# Patient Record
Sex: Female | Born: 1980
Health system: Southern US, Community
[De-identification: ages and names within clinical notes are randomized; demographics above are authoritative.]

## PROBLEM LIST (undated history)

## (undated) DIAGNOSIS — R7303 Prediabetes: Secondary | ICD-10-CM

## (undated) DIAGNOSIS — F419 Anxiety disorder, unspecified: Secondary | ICD-10-CM

## (undated) DIAGNOSIS — I10 Essential (primary) hypertension: Secondary | ICD-10-CM

---

## 2010-12-16 ENCOUNTER — Emergency Department: Payer: Self-pay | Admitting: Emergency Medicine

## 2016-09-27 ENCOUNTER — Encounter: Payer: Self-pay | Admitting: Gynecology

## 2016-09-27 ENCOUNTER — Ambulatory Visit
Admission: EM | Admit: 2016-09-27 | Discharge: 2016-09-27 | Disposition: A | Discharge status: Home / Self Care | Payer: 59 | Attending: Family Medicine | Admitting: Family Medicine

## 2016-09-27 DIAGNOSIS — M62830 Muscle spasm of back: Secondary | ICD-10-CM

## 2016-09-27 DIAGNOSIS — M62838 Other muscle spasm: Secondary | ICD-10-CM

## 2016-09-27 DIAGNOSIS — M545 Low back pain, unspecified: Secondary | ICD-10-CM

## 2016-09-27 HISTORY — DX: Essential (primary) hypertension: I10

## 2016-09-27 HISTORY — DX: Anxiety disorder, unspecified: F41.9

## 2016-09-27 MED ORDER — MELOXICAM 15 MG PO TABS: 15.0000 mg | ORAL_TABLET | Freq: Every day | ORAL | 0 refills | Status: DC | PRN

## 2016-09-27 MED ORDER — CYCLOBENZAPRINE HCL 10 MG PO TABS: 10.0000 mg | ORAL_TABLET | Freq: Two times a day (BID) | ORAL | 0 refills | Status: DC | PRN

## 2016-09-27 NOTE — ED Triage Notes (Signed)
Per patient severed back spasm x couple weeks. 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

## 2016-09-27 NOTE — Discharge Instructions (Signed)
Take medication as prescribed. Rest. Drink plenty of fluids. Stretch. Avoid strenuous activity. ° °Follow up with your primary care physician this week as needed. Return to Urgent care for new or worsening concerns.  ° °

## 2016-09-27 NOTE — ED Provider Notes (Signed)
[facility] ____________________________________________  Time seen: Approximately 6:11 PM  I have reviewed the triage vital signs and the nursing notes.   HISTORY  Chief Complaint Back Pain   HPI Doug SouDanielle D Streeper is a 36 y.o. female presenting for evaluation of low back pain that has been present for approximately 2 weeks. Patient reports she initially felt like she "tweaked" her back, saying she felt like she probably twisted the wrong way at some point. Denies any abrupt onset of back pain. States pain has gradually worsened. Patient states now acute worsening of pain in the last several days. Patient states that if she is being completely still she has minimal pain. States pain is mostly with twisting and bending. States she has taken some over-the-counter ibuprofen with slight improvement. Denies full resolution. Denies other alleviating measures. States occasionally notes some pain radiation to right buttocks, denies any other radiation. Denies any constant or current pain radiation. Denies any paresthesias, weakness, decreased range of motion, fall, trauma or known injury. States pain is primarily right lower back. States that she feels like she can fill a catching pain when she moves. Reports otherwise feels well. Denies any urinary changes, urinary pharmacy, urinary urgency, burning with urination or abdominal pain. Denies fevers. Reports otherwise feels well. Denies chronic back issues. Patient does however states that she does a lot of twisting moving bending and some lifting at work. Patient also states that she has been doing more working out at the gym over the last several months. Denies any heavy lifting.   Denies chest pain, shortness of breath, abdominal pain, dysuria, extremity pain, extremity swelling or rash. Denies recent sickness. Denies recent antibiotic use.   Patient's last menstrual period was 09/23/2016. Denies pregnancy.   Past Medical History:    Diagnosis Date  . Anxiety   . Hypertension     There are no active problems to display for this patient.   History reviewed. No pertinent surgical history.   No current facility-administered medications for this encounter.   Current Outpatient Prescriptions:  .  amLODipine (NORVASC) 10 MG tablet, Take 10 mg by mouth daily., Disp: , Rfl:  .  buPROPion (WELLBUTRIN SR) 150 MG 12 hr tablet, Take 150 mg by mouth 2 (two) times daily., Disp: , Rfl:  .  hydrochlorothiazide (HYDRODIURIL) 25 MG tablet, Take 25 mg by mouth daily., Disp: , Rfl:  .  cyclobenzaprine (FLEXERIL) 10 MG tablet, Take 1 tablet (10 mg total) by mouth 2 (two) times daily as needed for muscle spasms. Do not drive while taking as can cause drowsiness, Disp: 15 tablet, Rfl: 0 .  meloxicam (MOBIC) 15 MG tablet, Take 1 tablet (15 mg total) by mouth daily as needed., Disp: 10 tablet, Rfl: 0  Allergies Patient has no known allergies.  No family history on file.  Social History Social History  Substance Use Topics  . Smoking status: Current Some Day Smoker  . Smokeless tobacco: Never Used  . Alcohol use No    Review of Systems Constitutional: No fever/chills Cardiovascular: Denies chest pain. Respiratory: Denies shortness of breath. Gastrointestinal: No abdominal pain.  No nausea, no vomiting.   Genitourinary: Negative for dysuria. Musculoskeletal: positive for back pain. Skin: Negative for rash.  ____________________________________________   PHYSICAL EXAM:  VITAL SIGNS: ED Triage Vitals  Enc Vitals Group     BP 09/27/16 1446 (!) 151/63     Pulse Rate 09/27/16 1446 65     Resp 09/27/16 1446 16     Temp  09/27/16 1446 99 F (37.2 C)     Temp Source 09/27/16 1446 Oral     SpO2 09/27/16 1446 100 %     Weight 09/27/16 1444 (!) 325 lb (147.4 kg)     Height 09/27/16 1444 5\' 9"  (1.753 m)     Head Circumference --      Peak Flow --      Pain Score 09/27/16 1444 8     Pain Loc --      Pain Edu? --       Excl. in GC? --     Constitutional: Alert and oriented. Well appearing and in no acute distress.. Cardiovascular: Normal rate, regular rhythm. Grossly normal heart sounds.  Good peripheral circulation. Respiratory: Normal respiratory effort without tachypnea nor retractions. Breath sounds are clear and equal bilaterally. No wheezes, rales, rhonchi. Gastrointestinal: Soft and nontender. Obese abdomen.  Musculoskeletal:  No midline cervical, thoracic or lumbar tenderness to palpation. Bilateral pedal pulses equal and easily palpated. ambulatory with steady gait. Changes positions quickly in room.       Right lower leg:  No tenderness or edema.      Left lower leg:  No tenderness or edema.   No midline tenderness. Patient has right lower paralumbar and right lower latissimus dorsi point tenderness, no bony tenderness, full lumbar range of motion present, pain increases with right and left lumbar rotation with palpable muscle spasm, mild pain with lumbar flexion and extension,  bilateral plantar flexion and dorsiflexion strong and equal, no saddle anesthesia, steady gait.  Neurologic:  Normal speech and language. No gross focal neurologic deficits are appreciated. Speech is normal. No gait instability.  Skin:  Skin is warm, dry. Psychiatric: Mood and affect are normal. Speech and behavior are normal. Patient exhibits appropriate insight and judgment   ___________________________________________   LABS (all labs ordered are listed, but only abnormal results are displayed)  Labs Reviewed - No data to display ____________________________________________  RADIOLOGY  No results found. ____________________________________________   PROCEDURES Procedures   INITIAL IMPRESSION / ASSESSMENT AND PLAN / ED COURSE  Pertinent labs & imaging results that were available during my care of the patient were reviewed by me and considered in my medical decision making (see chart for  details).  Well-appearing patient. No acute distress. Suspect lumbar strain with muscle spasm. As no direct trauma or fall, will defer x-ray at this time, patient agrees. Will treat patient with oral daily mobic and muscle relaxant. Encouraged rest, stretch, ice heat alternation, fluids, and supportive care. Avoidance of strenuous activity. Work note given for today and tomorrow.Discussed indication, risks and benefits of medications with patient.  Discussed follow up with Primary care physician this week. Discussed follow up and return parameters including no resolution or any worsening concerns. Patient verbalized understanding and agreed to plan.   ____________________________________________   FINAL CLINICAL IMPRESSION(S) / ED DIAGNOSES  Final diagnoses:  Acute right-sided low back pain without sciatica  Muscle spasm     Discharge Medication List as of 09/27/2016  3:58 PM    START taking these medications   Details  cyclobenzaprine (FLEXERIL) 10 MG tablet Take 1 tablet (10 mg total) by mouth 2 (two) times daily as needed for muscle spasms. Do not drive while taking as can cause drowsiness, Starting Sun 09/27/2016, Normal    meloxicam (MOBIC) 15 MG tablet Take 1 tablet (15 mg total) by mouth daily as needed., Starting Sun 09/27/2016, Normal        Note: This dictation was  prepared with Dragon dictation along with smaller phrase technology. Any transcriptional errors that result from this process are unintentional.         Renford DillsMiller, Jozette Castrellon, NP 09/27/16 [id number]

## 2017-10-27 DIAGNOSIS — E7800 Pure hypercholesterolemia, unspecified: Secondary | ICD-10-CM | POA: Diagnosis not present

## 2017-10-27 DIAGNOSIS — J209 Acute bronchitis, unspecified: Secondary | ICD-10-CM | POA: Diagnosis not present

## 2017-10-27 DIAGNOSIS — J309 Allergic rhinitis, unspecified: Secondary | ICD-10-CM | POA: Diagnosis not present

## 2017-10-27 DIAGNOSIS — J029 Acute pharyngitis, unspecified: Secondary | ICD-10-CM | POA: Diagnosis not present

## 2017-11-08 DIAGNOSIS — T7840XA Allergy, unspecified, initial encounter: Secondary | ICD-10-CM | POA: Diagnosis not present

## 2017-11-25 ENCOUNTER — Other Ambulatory Visit (HOSPITAL_COMMUNITY)
Admission: RE | Admit: 2017-11-25 | Discharge: 2017-11-25 | Disposition: A | Discharge status: Home / Self Care | Payer: 59 | Source: Ambulatory Visit | Attending: Certified Nurse Midwife | Admitting: Certified Nurse Midwife

## 2017-11-25 ENCOUNTER — Ambulatory Visit (INDEPENDENT_AMBULATORY_CARE_PROVIDER_SITE_OTHER): Payer: 59 | Admitting: Certified Nurse Midwife

## 2017-11-25 ENCOUNTER — Encounter: Payer: Self-pay | Admitting: Certified Nurse Midwife

## 2017-11-25 VITALS — BP 150/98 | HR 96 | Ht 69.0 in | Wt 364.1 lb

## 2017-11-25 DIAGNOSIS — Z01419 Encounter for gynecological examination (general) (routine) without abnormal findings: Secondary | ICD-10-CM | POA: Insufficient documentation

## 2017-11-25 DIAGNOSIS — Z124 Encounter for screening for malignant neoplasm of cervix: Secondary | ICD-10-CM | POA: Diagnosis not present

## 2017-11-25 DIAGNOSIS — Z113 Encounter for screening for infections with a predominantly sexual mode of transmission: Secondary | ICD-10-CM | POA: Diagnosis not present

## 2017-11-25 MED ORDER — BUPROPION HCL ER (XL) 150 MG PO TB24: 150.0000 mg | ORAL_TABLET | Freq: Every day | ORAL | 2 refills | Status: DC

## 2017-11-25 NOTE — Progress Notes (Signed)
ANNUAL PREVENTATIVE CARE GYN  ENCOUNTER NOTE  Subjective:       Denise Vaughn is a 37 y.o. G0P0000 female here for a routine annual gynecologic exam.  Current complaints: 1. Needs Pap smear 2. Desires STI testing 3. Would like to restart medication for anxiety 4. Questions smoking cessation  PCP: [facility]. Forgot to take evening dose of Norvasc last night. Stopped taking Wellbutrin, because she felt like it "wasn't working".   Goes to the gym approximately three (3) times a week. Has lost 60 pounds. Smokes three (3) black and milds per day. Works in [employer]. Take blood pressure medication at night due to limited breaks allowed while working.   Denies difficulty breathing or respiratory distress, chest pain, abdominal pain, excessive vaginal bleeding, dysuria, and leg pain.    Gynecologic History  Patient's last menstrual period was 11/13/2017 (exact date).  Period Cycle (Days): 28 Period Duration (Days): Three (3) to four (4) Period Pattern: Regular Menstrual Flow: Heavy Menstrual Control: Maxi pad Menstrual Control Change Freq (Hours): Six (6) to eight (8) Dysmenorrhea: (!) Mild Dysmenorrhea Symptoms: Cramping  Contraception: condoms   Last Pap: unsure.   Obstetric History  OB History  Gravida Para Term Preterm AB Living  0 0 0 0 0 0  SAB TAB Ectopic Multiple Live Births  0 0 0 0 0    Past Medical History:  Diagnosis Date  . Anxiety   . Hypertension     No past surgical history on file.  Current Outpatient Medications on File Prior to Visit  Medication Sig Dispense Refill  . amLODipine (NORVASC) 10 MG tablet Take 10 mg by mouth daily.    . cyclobenzaprine (FLEXERIL) 10 MG tablet Take 1 tablet (10 mg total) by mouth 2 (two) times daily as needed for muscle spasms. Do not drive while taking as can cause drowsiness 15 tablet 0  . hydrochlorothiazide (HYDRODIURIL) 25 MG tablet Take 25 mg by mouth daily.    . meloxicam (MOBIC) 15 MG tablet Take 1  tablet (15 mg total) by mouth daily as needed. 10 tablet 0  . buPROPion (WELLBUTRIN SR) 150 MG 12 hr tablet Take 150 mg by mouth 2 (two) times daily.     No current facility-administered medications on file prior to visit.     Allergies  Allergen Reactions  . Augmentin [Amoxicillin-Pot Clavulanate] Rash    Itching and facial swelling    Social History   Socioeconomic History  . Marital status: Single    Spouse name: Not on file  . Number of children: Not on file  . Years of education: Not on file  . Highest education level: Not on file  Occupational History  . Not on file  Social Needs  . Financial resource strain: Not on file  . Food insecurity:    Worry: Not on file    Inability: Not on file  . Transportation needs:    Medical: Not on file    Non-medical: Not on file  Tobacco Use  . Smoking status: Current Some Day Smoker  . Smokeless tobacco: Never Used  Substance and Sexual Activity  . Alcohol use: No  . Drug use: No  . Sexual activity: Not Currently    Birth control/protection: Condom  Lifestyle  . Physical activity:    Days per week: Not on file    Minutes per session: Not on file  . Stress: Not on file  Relationships  . Social connections:    Talks on phone: Not  on file    Gets together: Not on file    Attends religious service: Not on file    Active member of club or organization: Not on file    Attends meetings of clubs or organizations: Not on file    Relationship status: Not on file  . Intimate partner violence:    Fear of current or ex partner: Not on file    Emotionally abused: Not on file    Physically abused: Not on file    Forced sexual activity: Not on file  Other Topics Concern  . Not on file  Social History Narrative  . Not on file    Family History  Problem Relation Age of Onset  . Diabetes Mother     The following portions of the patient's history were reviewed and updated as appropriate: allergies, current medications, past  family history, past medical history, past social history, past surgical history and problem list.  Review of Systems  ROS negative except as noted above. Information obtained from patient.    Objective:   BP (!) 150/98   Pulse 96   Ht 5\' 9"  (1.753 m)   Wt (!) 364 lb 1 oz (165.1 kg)   LMP 11/13/2017 (Exact Date)   BMI 53.76 kg/m    CONSTITUTIONAL: Well-developed, well-nourished female in no acute distress.   PSYCHIATRIC: Normal mood and affect. Normal behavior. Normal judgment and thought content.  NEUROLGIC: Alert and oriented to person, place, and time. Normal muscle tone coordination. No cranial nerve deficit noted.  HENT:  Normocephalic, atraumatic, External right and left ear normal.   EYES: Conjunctivae and EOM are normal. Pupils are equal and round.   NECK: Normal range of motion, supple, no masses.  Normal thyroid.   SKIN: Skin is warm and dry. No rash noted. Not diaphoretic. No erythema. No pallor.  CARDIOVASCULAR: Normal heart rate noted, regular rhythm, no murmur.  RESPIRATORY: Clear to auscultation bilaterally. Effort and breath sounds normal, no problems with respiration noted.  BREASTS: Symmetric in size. No masses, skin changes, nipple drainage, or lymphadenopathy.  ABDOMEN: Soft, normal bowel sounds, no distention noted.  No tenderness, rebound or guarding. Obese.   PELVIC:  External Genitalia: Normal  Vagina: Normal  Cervix: Normal, Pap collected  Uterus: Unable to palpate due to habitus  Adnexa: Unable to palpate due to habitus   MUSCULOSKELETAL: Normal range of motion. No tenderness.  No cyanosis, clubbing, or edema.  2+ distal pulses.  LYMPHATIC: No Axillary, Supraclavicular, or Inguinal Adenopathy.  GAD 7 : Generalized Anxiety Score 11/25/2017  Nervous, Anxious, on Edge 2  Control/stop worrying 3  Worry too much - different things 3  Trouble relaxing 3  Restless 2  Easily annoyed or irritable 2  Afraid - awful might happen 1  Total GAD 7  Score 16  Anxiety Difficulty Not difficult at all    Assessment:   Annual gynecologic examination 37 y.o.   Contraception: condoms   Class 3 Obesity   Problem List Items Addressed This Visit    None    Visit Diagnoses    Well woman exam    -  Primary   Relevant Orders   Cytology - PAP   Morbid obesity (HCC)       Screening for cervical cancer       Relevant Orders   Cytology - PAP   Screen for STD (sexually transmitted disease)       Relevant Orders   Cytology - PAP  Plan:   Pap: See orders  Labs: Managed by PCP   Routine preventative health maintenance measures emphasized: Exercise/Diet/Weight control, Tobacco Warnings, Alcohol/Substance use risks, Stress Management and Safe Sex; see AVS  Education regarding  1-800-Quitline and special programs in [place]; pt verbalized understanding  Rx: Wellbutrin, see orders  Reviewed red flag symptoms and when to call  RTC x 4-6 weeks for medication check  RTC x 1 year for ANNUAL EXAM or sooner if needed   Gunnar Bulla, CNM [facility]

## 2017-11-25 NOTE — Patient Instructions (Addendum)
Preventive Care 18-39 Years, Female Preventive care refers to lifestyle choices and visits with your health care provider that can promote health and wellness. What does preventive care include?  A yearly physical exam. This is also called an annual well check.  Dental exams once or twice a year.  Routine eye exams. Ask your health care provider how often you should have your eyes checked.  Personal lifestyle choices, including: ? Daily care of your teeth and gums. ? Regular physical activity. ? Eating a healthy diet. ? Avoiding tobacco and drug use. ? Limiting alcohol use. ? Practicing safe sex. ? Taking vitamin and mineral supplements as recommended by your health care provider. What happens during an annual well check? The services and screenings done by your health care provider during your annual well check will depend on your age, overall health, lifestyle risk factors, and family history of disease. Counseling Your health care provider may ask you questions about your:  Alcohol use.  Tobacco use.  Drug use.  Emotional well-being.  Home and relationship well-being.  Sexual activity.  Eating habits.  Work and work Statistician.  Method of birth control.  Menstrual cycle.  Pregnancy history.  Screening You may have the following tests or measurements:  Height, weight, and BMI.  Diabetes screening. This is done by checking your blood sugar (glucose) after you have not eaten for a while (fasting).  Blood pressure.  Lipid and cholesterol levels. These may be checked every 5 years starting at age 66.  Skin check.  Hepatitis C blood test.  Hepatitis B blood test.  Sexually transmitted disease (STD) testing.  BRCA-related cancer screening. This may be done if you have a family history of breast, ovarian, tubal, or peritoneal cancers.  Pelvic exam and Pap test. This may be done every 3 years starting at age 40. Starting at age 59, this may be done every 5  years if you have a Pap test in combination with an HPV test.  Discuss your test results, treatment options, and if necessary, the need for more tests with your health care provider. Vaccines Your health care provider may recommend certain vaccines, such as:  Influenza vaccine. This is recommended every year.  Tetanus, diphtheria, and acellular pertussis (Tdap, Td) vaccine. You may need a Td booster every 10 years.  Varicella vaccine. You may need this if you have not been vaccinated.  HPV vaccine. If you are 69 or younger, you may need three doses over 6 months.  Measles, mumps, and rubella (MMR) vaccine. You may need at least one dose of MMR. You may also need a second dose.  Pneumococcal 13-valent conjugate (PCV13) vaccine. You may need this if you have certain conditions and were not previously vaccinated.  Pneumococcal polysaccharide (PPSV23) vaccine. You may need one or two doses if you smoke cigarettes or if you have certain conditions.  Meningococcal vaccine. One dose is recommended if you are age 27-21 years and a first-year college student living in a residence hall, or if you have one of several medical conditions. You may also need additional booster doses.  Hepatitis A vaccine. You may need this if you have certain conditions or if you travel or work in places where you may be exposed to hepatitis A.  Hepatitis B vaccine. You may need this if you have certain conditions or if you travel or work in places where you may be exposed to hepatitis B.  Haemophilus influenzae type b (Hib) vaccine. You may need this if  you have certain risk factors.  Talk to your health care provider about which screenings and vaccines you need and how often you need them. This information is not intended to replace advice given to you by your health care provider. Make sure you discuss any questions you have with your health care provider. Document Released: 04/07/2001 Document Revised: 10/30/2015  Document Reviewed: 12/11/2014 Elsevier Interactive Patient Education  2018 [employer].  Bupropion tablets (Depression/Mood Disorders) What is this medicine? BUPROPION (byoo PROE pee on) is used to treat depression. This medicine may be used for other purposes; ask your health care provider or pharmacist if you have questions. COMMON BRAND NAME(S): Wellbutrin What should I tell my health care provider before I take this medicine? They need to know if you have any of these conditions: -an eating disorder, such as anorexia or bulimia -bipolar disorder or psychosis -diabetes or high blood sugar, treated with medication -glaucoma -heart disease, previous heart attack, or irregular heart beat -head injury or brain tumor -high blood pressure -kidney or liver disease -seizures -suicidal thoughts or a previous suicide attempt -Tourette's syndrome -weight loss -an unusual or allergic reaction to bupropion, other medicines, foods, dyes, or preservatives -breast-feeding -pregnant or trying to become pregnant How should I use this medicine? Take this medicine by mouth with a glass of water. Follow the directions on the prescription label. You can take it with or without food. If it upsets your stomach, take it with food. Take your medicine at regular intervals. Do not take your medicine more often than directed. Do not stop taking this medicine suddenly except upon the advice of your doctor. Stopping this medicine too quickly may cause serious side effects or your condition may worsen. A special MedGuide will be given to you by the pharmacist with each prescription and refill. Be sure to read this information carefully each time. Talk to your pediatrician regarding the use of this medicine in children. Special care may be needed. Overdosage: If you think you have taken too much of this medicine contact a poison control center or emergency room at once. NOTE: This medicine is only for you. Do not  share this medicine with others. What if I miss a dose? If you miss a dose, take it as soon as you can. If it is less than four hours to your next dose, take only that dose and skip the missed dose. Do not take double or extra doses. What may interact with this medicine? Do not take this medicine with any of the following medications: -linezolid -MAOIs like Azilect, Carbex, Eldepryl, Marplan, Nardil, and Parnate -methylene blue (injected into a vein) -other medicines that contain bupropion like Zyban This medicine may also interact with the following medications: -alcohol -certain medicines for anxiety or sleep -certain medicines for blood pressure like metoprolol, propranolol -certain medicines for depression or psychotic disturbances -certain medicines for HIV or AIDS like efavirenz, lopinavir, nelfinavir, ritonavir -certain medicines for irregular heart beat like propafenone, flecainide -certain medicines for Parkinson's disease like amantadine, levodopa -certain medicines for seizures like carbamazepine, phenytoin, phenobarbital -cimetidine -clopidogrel -cyclophosphamide -digoxin -furazolidone -isoniazid -nicotine -orphenadrine -procarbazine -steroid medicines like prednisone or cortisone -stimulant medicines for attention disorders, weight loss, or to stay awake -tamoxifen -theophylline -thiotepa -ticlopidine -tramadol -warfarin This list may not describe all possible interactions. Give your health care provider a list of all the medicines, herbs, non-prescription drugs, or dietary supplements you use. Also tell them if you smoke, drink alcohol, or use illegal drugs. Some items  may interact with your medicine. What should I watch for while using this medicine? Tell your doctor if your symptoms do not get better or if they get worse. Visit your doctor or health care professional for regular checks on your progress. Because it may take several weeks to see the full effects of  this medicine, it is important to continue your treatment as prescribed by your doctor. Patients and their families should watch out for new or worsening thoughts of suicide or depression. Also watch out for sudden changes in feelings such as feeling anxious, agitated, panicky, irritable, hostile, aggressive, impulsive, severely restless, overly excited and hyperactive, or not being able to sleep. If this happens, especially at the beginning of treatment or after a change in dose, call your health care professional. Avoid alcoholic drinks while taking this medicine. Drinking excessive alcoholic beverages, using sleeping or anxiety medicines, or quickly stopping the use of these agents while taking this medicine may increase your risk for a seizure. Do not drive or use heavy machinery until you know how this medicine affects you. This medicine can impair your ability to perform these tasks. Do not take this medicine close to bedtime. It may prevent you from sleeping. Your mouth may get dry. Chewing sugarless gum or sucking hard candy, and drinking plenty of water may help. Contact your doctor if the problem does not go away or is severe. What side effects may I notice from receiving this medicine? Side effects that you should report to your doctor or health care professional as soon as possible: -allergic reactions like skin rash, itching or hives, swelling of the face, lips, or tongue -breathing problems -changes in vision -confusion -elevated mood, decreased need for sleep, racing thoughts, impulsive behavior -fast or irregular heartbeat -hallucinations, loss of contact with reality -increased blood pressure -redness, blistering, peeling or loosening of the skin, including inside the mouth -seizures -suicidal thoughts or other mood changes -unusually weak or tired -vomiting Side effects that usually do not require medical attention (report to your doctor or health care professional if they  continue or are bothersome): -constipation -headache -loss of appetite -nausea -tremors -weight loss This list may not describe all possible side effects. Call your doctor for medical advice about side effects. You may report side effects to FDA at 1-800-FDA-1088. Where should I keep my medicine? Keep out of the reach of children. Store at room temperature between 20 and 25 degrees C (68 and 77 degrees F), away from direct sunlight and moisture. Keep tightly closed. Throw away any unused medicine after the expiration date. NOTE: This sheet is a summary. It may not cover all possible information. If you have questions about this medicine, talk to your doctor, pharmacist, or health care provider.  2018 Elsevier/Gold Standard (2015-08-02 13:44:21)

## 2017-11-28 ENCOUNTER — Encounter: Payer: Self-pay | Admitting: Certified Nurse Midwife

## 2017-11-30 LAB — CYTOLOGY - PAP
CHLAMYDIA, DNA PROBE: NEGATIVE
DIAGNOSIS: NEGATIVE
HPV (WINDOPATH): NOT DETECTED
NEISSERIA GONORRHEA: NEGATIVE
Trichomonas: NEGATIVE

## 2017-11-30 LAB — CERVICOVAGINAL ANCILLARY ONLY: HERPES (WINDOWPATH): NEGATIVE

## 2017-12-02 ENCOUNTER — Telehealth: Payer: Self-pay | Admitting: Certified Nurse Midwife

## 2017-12-02 NOTE — Telephone Encounter (Signed)
The patient called and stated that she would like a call back from her nurse or provider in regards to disclosing her recent test results. The patient stated that she received a notification from MyChart stating that her test results "were in the office and that she needed to call the office to get them" I let the patient know that MyChart doesn't typically send out those kind of notifications, But when the results come in a provider will review them and contact the patient as soon as they possibly can. No other information was disclosed. Please advise.

## 2017-12-03 NOTE — Telephone Encounter (Signed)
Pt viewed mychart message from [facility] with test results on 12/02/17 at 2:30.

## 2018-01-06 ENCOUNTER — Encounter: Payer: 59 | Admitting: Certified Nurse Midwife

## 2018-04-06 DIAGNOSIS — J069 Acute upper respiratory infection, unspecified: Secondary | ICD-10-CM | POA: Diagnosis not present

## 2018-04-12 ENCOUNTER — Other Ambulatory Visit: Payer: Self-pay

## 2018-04-12 ENCOUNTER — Ambulatory Visit
Admission: EM | Admit: 2018-04-12 | Discharge: 2018-04-12 | Disposition: A | Discharge status: Home / Self Care | Payer: 59 | Attending: Family Medicine | Admitting: Family Medicine

## 2018-04-12 ENCOUNTER — Encounter: Payer: Self-pay | Admitting: Emergency Medicine

## 2018-04-12 DIAGNOSIS — R0602 Shortness of breath: Secondary | ICD-10-CM | POA: Diagnosis not present

## 2018-04-12 DIAGNOSIS — I10 Essential (primary) hypertension: Secondary | ICD-10-CM

## 2018-04-12 DIAGNOSIS — Z720 Tobacco use: Secondary | ICD-10-CM | POA: Diagnosis not present

## 2018-04-12 DIAGNOSIS — R0789 Other chest pain: Secondary | ICD-10-CM

## 2018-04-12 NOTE — ED Provider Notes (Signed)
[facility]    CSN: [id number] Arrival date & time: 04/12/18  1833  History   Chief Complaint Chief Complaint  Patient presents with  . Shortness of Breath   HPI  38 year old female presents with shortness of breath.  Patient reports that on Wednesday she had chest pain and tightness.  Radiated to the jaw.  Lasted 10 minutes and then resolved.  Since that time she has had intermittent bouts of chest tightness.  She is also had associated shortness of breath and throat tightness.  She is had some lightheadedness today.  Patient states that she is under a great deal of stress at work and at home.  No current chest pain.  Patient has been drinking a lot of caffeine and taking caffeine pills.  This may be contributing to her symptoms.  Patient is a current smoker.  No other associated symptoms.  No other complaints.  History reviewed as below. Past Medical History:  Diagnosis Date  . Anxiety   . Hypertension   Morbid obesity  OB History    Gravida  0   Para  0   Term  0   Preterm  0   AB  0   Living  0     SAB  0   TAB  0   Ectopic  0   Multiple  0   Live Births  0          Home Medications    Prior to Admission medications   Medication Sig Start Date End Date Taking? Authorizing Provider  amLODipine (NORVASC) 10 MG tablet Take 10 mg by mouth daily.   Yes [provider]  hydrochlorothiazide (HYDRODIURIL) 25 MG tablet Take 25 mg by mouth daily.   Yes [provider]  meloxicam (MOBIC) 15 MG tablet Take 1 tablet (15 mg total) by mouth daily as needed. 09/27/16  Yes Renford Dills, NP  cyclobenzaprine (FLEXERIL) 10 MG tablet Take 1 tablet (10 mg total) by mouth 2 (two) times daily as needed for muscle spasms. Do not drive while taking as can cause drowsiness 09/27/16   Renford Dills, NP    Family History Family History  Problem Relation Age of Onset  . Diabetes Mother     Social History Social History   Tobacco Use  .  Smoking status: Current Some Day Smoker    Types: Cigarettes  . Smokeless tobacco: Never Used  Substance Use Topics  . Alcohol use: No  . Drug use: No     Allergies   Augmentin [amoxicillin-pot clavulanate]   Review of Systems Review of Systems  HENT:       Throat tightness.  Respiratory: Positive for chest tightness and shortness of breath.   Cardiovascular: Positive for chest pain.  Neurological: Positive for light-headedness.   Physical Exam Triage Vital Signs ED Triage Vitals  Enc Vitals Group     BP 04/12/18 1840 (!) 160/81     Pulse Rate 04/12/18 1840 86     Resp 04/12/18 1840 16     Temp 04/12/18 1840 98.9 F (37.2 C)     Temp Source 04/12/18 1840 Oral     SpO2 04/12/18 1840 99 %     Weight 04/12/18 1839 280 lb (127 kg)     Height 04/12/18 1839 5\' 10"  (1.778 m)     Head Circumference --      Peak Flow --      Pain Score 04/12/18 1839 5     Pain  Loc --      Pain Edu? --      Excl. in GC? --    Updated Vital Signs BP (!) 160/81 (BP Location: Left Arm)   Pulse 86   Temp 98.9 F (37.2 C) (Oral)   Resp 16   Ht 5\' 10"  (1.778 m)   Wt 127 kg   LMP 04/05/2018 (Approximate)   SpO2 99%   BMI 40.18 kg/m   Visual Acuity Right Eye Distance:   Left Eye Distance:   Bilateral Distance:    Right Eye Near:   Left Eye Near:    Bilateral Near:     Physical Exam Vitals signs and nursing note reviewed.  Constitutional:      General: She is not in acute distress.    Appearance: She is well-developed. She is obese.  HENT:     Head: Normocephalic and atraumatic.     Nose: Nose normal.  Eyes:     General:        Right eye: No discharge.     Conjunctiva/sclera: Conjunctivae normal.  Cardiovascular:     Rate and Rhythm: Normal rate and regular rhythm.  Pulmonary:     Effort: Pulmonary effort is normal.     Breath sounds: Normal breath sounds. No wheezing, rhonchi or rales.  Neurological:     Mental Status: She is alert.  Psychiatric:        Mood and  Affect: Mood normal.        Behavior: Behavior normal.    UC Treatments / Results  Labs (all labs ordered are listed, but only abnormal results are displayed) Labs Reviewed - No data to display  EKG Interpretation: Normal sinus rhythm with a rate of 71.  Significant artifact noted.  No appreciable ST or T wave changes other than artifact.  Incomplete right bundle branch block.    Radiology No results found.  Procedures Procedures (including critical care time)  Medications Ordered in UC Medications - No data to display  Initial Impression / Assessment and Plan / UC Course  I have reviewed the triage vital signs and the nursing notes.  Pertinent labs & imaging results that were available during my care of the patient were reviewed by me and considered in my medical decision making (see chart for details).    38 year old female presents with chest tightness/pain and SOB.  EKG essentially unremarkable.  This is likely anxiety driven.  Advised rest and conservative care.  Final Clinical Impressions(s) / UC Diagnoses   Final diagnoses:  SOB (shortness of breath)     Discharge Instructions     EKG unremarkable.  This is likely anxiety driven.  Rest.   Take care  Dr. Adriana Simas     ED Prescriptions    None     Controlled Substance Prescriptions [place] Controlled Substance Registry consulted? Not Applicable   Tommie Sams, DO 04/12/18 2132

## 2018-04-12 NOTE — Discharge Instructions (Signed)
EKG unremarkable.  This is likely anxiety driven.  Rest.   Take care  Dr. Adriana Simas

## 2018-04-12 NOTE — ED Triage Notes (Signed)
Patient c/o SOB and chest pain that started on Wed.  Patient states that she has been stressed.  Patient reports having sinus problems prior to these symptoms.

## 2019-01-09 ENCOUNTER — Emergency Department
Admission: EM | Admit: 2019-01-09 | Discharge: 2019-01-09 | Disposition: A | Discharge status: Home / Self Care | Payer: 59 | Attending: Emergency Medicine | Admitting: Emergency Medicine

## 2019-01-09 ENCOUNTER — Other Ambulatory Visit: Payer: Self-pay

## 2019-01-09 ENCOUNTER — Emergency Department: Payer: 59

## 2019-01-09 ENCOUNTER — Encounter: Payer: Self-pay | Admitting: Emergency Medicine

## 2019-01-09 DIAGNOSIS — S39012A Strain of muscle, fascia and tendon of lower back, initial encounter: Secondary | ICD-10-CM | POA: Diagnosis not present

## 2019-01-09 DIAGNOSIS — Y999 Unspecified external cause status: Secondary | ICD-10-CM | POA: Insufficient documentation

## 2019-01-09 DIAGNOSIS — M79605 Pain in left leg: Secondary | ICD-10-CM | POA: Insufficient documentation

## 2019-01-09 DIAGNOSIS — I10 Essential (primary) hypertension: Secondary | ICD-10-CM | POA: Diagnosis not present

## 2019-01-09 DIAGNOSIS — S199XXA Unspecified injury of neck, initial encounter: Secondary | ICD-10-CM | POA: Diagnosis present

## 2019-01-09 DIAGNOSIS — Z79899 Other long term (current) drug therapy: Secondary | ICD-10-CM | POA: Insufficient documentation

## 2019-01-09 DIAGNOSIS — S161XXA Strain of muscle, fascia and tendon at neck level, initial encounter: Secondary | ICD-10-CM | POA: Diagnosis not present

## 2019-01-09 DIAGNOSIS — Y92410 Unspecified street and highway as the place of occurrence of the external cause: Secondary | ICD-10-CM | POA: Insufficient documentation

## 2019-01-09 DIAGNOSIS — Y939 Activity, unspecified: Secondary | ICD-10-CM | POA: Diagnosis not present

## 2019-01-09 DIAGNOSIS — R0789 Other chest pain: Secondary | ICD-10-CM | POA: Diagnosis not present

## 2019-01-09 DIAGNOSIS — F17210 Nicotine dependence, cigarettes, uncomplicated: Secondary | ICD-10-CM | POA: Insufficient documentation

## 2019-01-09 DIAGNOSIS — S29012A Strain of muscle and tendon of back wall of thorax, initial encounter: Secondary | ICD-10-CM | POA: Insufficient documentation

## 2019-01-09 LAB — CBC
HCT: 37.5 % (ref 36.0–46.0)
Hemoglobin: 12.5 g/dL (ref 12.0–15.0)
MCH: 31.1 pg (ref 26.0–34.0)
MCHC: 33.3 g/dL (ref 30.0–36.0)
MCV: 93.3 fL (ref 80.0–100.0)
Platelets: 266 10*3/uL (ref 150–400)
RBC: 4.02 MIL/uL (ref 3.87–5.11)
RDW: 13.1 % (ref 11.5–15.5)
WBC: 11.6 10*3/uL — ABNORMAL HIGH (ref 4.0–10.5)
nRBC: 0 % (ref 0.0–0.2)

## 2019-01-09 LAB — DIFFERENTIAL
Abs Immature Granulocytes: 0.04 10*3/uL (ref 0.00–0.07)
Basophils Absolute: 0 10*3/uL (ref 0.0–0.1)
Basophils Relative: 0 %
Eosinophils Absolute: 0.4 10*3/uL (ref 0.0–0.5)
Eosinophils Relative: 3 %
Immature Granulocytes: 0 %
Lymphocytes Relative: 36 %
Lymphs Abs: 4.2 10*3/uL — ABNORMAL HIGH (ref 0.7–4.0)
Monocytes Absolute: 1 10*3/uL (ref 0.1–1.0)
Monocytes Relative: 8 %
Neutro Abs: 6 10*3/uL (ref 1.7–7.7)
Neutrophils Relative %: 53 %

## 2019-01-09 LAB — COMPREHENSIVE METABOLIC PANEL WITH GFR
ALT: 20 U/L (ref 0–44)
AST: 27 U/L (ref 15–41)
Albumin: 3.8 g/dL (ref 3.5–5.0)
Alkaline Phosphatase: 66 U/L (ref 38–126)
Anion gap: 9 (ref 5–15)
BUN: 14 mg/dL (ref 6–20)
CO2: 25 mmol/L (ref 22–32)
Calcium: 9.5 mg/dL (ref 8.9–10.3)
Chloride: 105 mmol/L (ref 98–111)
Creatinine, Ser: 0.82 mg/dL (ref 0.44–1.00)
GFR calc Af Amer: >60 mL/min (ref >60)
GFR calc non Af Amer: >60 mL/min (ref >60)
Glucose, Bld: 73 mg/dL (ref 70–99)
Potassium: 4.5 mmol/L (ref 3.5–5.1)
Sodium: 139 mmol/L (ref 135–145)
Total Bilirubin: 0.8 mg/dL (ref 0.3–1.2)
Total Protein: 7.8 g/dL (ref 6.5–8.1)

## 2019-01-09 LAB — LIPASE, BLOOD: Lipase: 29 U/L (ref 11–51)

## 2019-01-09 LAB — HCG, QUANTITATIVE, PREGNANCY: hCG, Beta Chain, Quant, S: <1 m[IU]/mL (ref <5)

## 2019-01-09 MED ORDER — IOHEXOL 350 MG/ML SOLN
100.0000 mL | Freq: Once | INTRAVENOUS | Status: AC | PRN
  Administered 2019-01-09: 12:00:00 100 mL via INTRAVENOUS

## 2019-01-09 MED ORDER — KETOROLAC TROMETHAMINE 10 MG PO TABS: 10.0000 mg | ORAL_TABLET | Freq: Four times a day (QID) | ORAL | 0 refills | Status: DC | PRN

## 2019-01-09 MED ORDER — KETOROLAC TROMETHAMINE 30 MG/ML IJ SOLN
15.0000 mg | INTRAMUSCULAR | Status: AC
  Administered 2019-01-09: 13:00:00 15 mg via INTRAVENOUS
  Filled 2019-01-09: qty 1

## 2019-01-09 MED ORDER — SODIUM CHLORIDE 0.9 % IV BOLUS
1000.0000 mL | Freq: Once | INTRAVENOUS | Status: AC
  Administered 2019-01-09: 10:00:00 1000 mL via INTRAVENOUS

## 2019-01-09 MED ORDER — MORPHINE SULFATE (PF) 4 MG/ML IV SOLN
4.0000 mg | Freq: Once | INTRAVENOUS | Status: AC
  Administered 2019-01-09: 4 mg via INTRAVENOUS
  Filled 2019-01-09 (×2): qty 1

## 2019-01-09 NOTE — ED Notes (Signed)
Recollect lavender sent to lab 

## 2019-01-09 NOTE — Discharge Instructions (Signed)
Your imaging and lab tests were all okay today.  We do not see any serious injuries, and your pains appear to be due to various muscle injuries from the impact.  You will have soreness for several days, which can be managed with anti-inflammatory medicine.  Using ice today and a heating pad starting tomorrow can help as well.
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
## 2019-01-09 NOTE — ED Notes (Signed)
Patient transported to CT 

## 2019-01-09 NOTE — ED Notes (Signed)
Esign not working at this time. Pt verbalized discharge instructions and has no questions at this time. 
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
## 2019-01-11 ENCOUNTER — Other Ambulatory Visit: Payer: Self-pay

## 2019-01-11 ENCOUNTER — Encounter: Payer: Self-pay | Admitting: Emergency Medicine

## 2019-01-11 ENCOUNTER — Emergency Department
Admission: EM | Admit: 2019-01-11 | Discharge: 2019-01-11 | Disposition: A | Discharge status: Home / Self Care | Payer: 59 | Attending: Emergency Medicine | Admitting: Emergency Medicine

## 2019-01-11 DIAGNOSIS — M549 Dorsalgia, unspecified: Secondary | ICD-10-CM | POA: Diagnosis not present

## 2019-01-11 DIAGNOSIS — I10 Essential (primary) hypertension: Secondary | ICD-10-CM | POA: Diagnosis not present

## 2019-01-11 DIAGNOSIS — M542 Cervicalgia: Secondary | ICD-10-CM | POA: Insufficient documentation

## 2019-01-11 DIAGNOSIS — F17210 Nicotine dependence, cigarettes, uncomplicated: Secondary | ICD-10-CM | POA: Insufficient documentation

## 2019-01-11 DIAGNOSIS — Z79899 Other long term (current) drug therapy: Secondary | ICD-10-CM | POA: Insufficient documentation

## 2019-01-11 DIAGNOSIS — M7918 Myalgia, other site: Secondary | ICD-10-CM

## 2019-01-11 MED ORDER — METHOCARBAMOL 500 MG PO TABS: 500.0000 mg | ORAL_TABLET | Freq: Four times a day (QID) | ORAL | 0 refills | Status: DC | PRN

## 2019-01-11 MED ORDER — ETODOLAC 400 MG PO TABS: 400.0000 mg | ORAL_TABLET | Freq: Two times a day (BID) | ORAL | 0 refills | Status: DC
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
## 2019-01-11 NOTE — Discharge Instructions (Signed)
Follow-up with the walk-in clinic [facility] if any continued problems.  Again taking etodolac 400 mg twice daily with food.  Methocarbamol 500 mg 1 every 6 hours if needed for muscle spasms.  Be aware that this medication could cause drowsiness and increase your risk for injury.  Do not drive or operate machinery while taking the methocarbamol.  You may use ice or heat to your muscles as needed for discomfort.  Also move as much as possible to decrease the soreness in your muscles.
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
## 2019-05-17 ENCOUNTER — Encounter: Payer: Self-pay | Admitting: Emergency Medicine

## 2019-05-17 ENCOUNTER — Emergency Department
Admission: EM | Admit: 2019-05-17 | Discharge: 2019-05-17 | Disposition: A | Discharge status: Home / Self Care | Payer: 59 | Attending: Student in an Organized Health Care Education/Training Program | Admitting: Student in an Organized Health Care Education/Training Program

## 2019-05-17 ENCOUNTER — Other Ambulatory Visit: Payer: Self-pay

## 2019-05-17 DIAGNOSIS — R0789 Other chest pain: Secondary | ICD-10-CM | POA: Diagnosis not present

## 2019-05-17 DIAGNOSIS — I10 Essential (primary) hypertension: Secondary | ICD-10-CM | POA: Diagnosis not present

## 2019-05-17 DIAGNOSIS — F17210 Nicotine dependence, cigarettes, uncomplicated: Secondary | ICD-10-CM | POA: Insufficient documentation

## 2019-05-17 DIAGNOSIS — R079 Chest pain, unspecified: Secondary | ICD-10-CM

## 2019-05-17 MED ORDER — KETOROLAC TROMETHAMINE 10 MG PO TABS: 10.0000 mg | ORAL_TABLET | Freq: Four times a day (QID) | ORAL | 0 refills | Status: AC | PRN

## 2019-05-17 MED ORDER — BACLOFEN 10 MG PO TABS: 10.0000 mg | ORAL_TABLET | Freq: Two times a day (BID) | ORAL | 1 refills | Status: AC

## 2019-05-17 MED ORDER — KETOROLAC TROMETHAMINE 30 MG/ML IJ SOLN
30.0000 mg | Freq: Once | INTRAMUSCULAR | Status: AC
  Administered 2019-05-17: 30 mg via INTRAMUSCULAR
  Filled 2019-05-17: qty 1

## 2019-05-17 NOTE — ED Triage Notes (Signed)
Pt in via POV, reports ongoing right mid back pain that has moved around to right chest over the last 3 weeks.  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

## 2019-05-17 NOTE — ED Provider Notes (Signed)
Emergency Department Provider Note  ____________________________________________  Time seen: Approximately 7:47 PM  I have reviewed the triage vital signs and the nursing notes.   HISTORY  Chief Complaint Back Pain   Historian Patient    HPI Denise Vaughn is a 39 y.o. female with a history of anxiety and hypertension, presents to the emergency department with right-sided chest pain worsened with deep inspiration, laughter and talking.  Patient states that she feels pain along the right side of her neck and along her upper trapezius.  She has been prescribed meloxicam and Robaxin by her primary care provider for muscle spasms and she reports that her pain has not improved significantly.  She does report that the tenderness along her right chest is reproducible.  She denies shortness of breath, chest tightness or palpitations.  Patient states that she recently started birth control 1 month ago and does have a history of obesity.  She denies recent travel or prolonged immobilization.  She denies history of DVT or PE.  She states that she quit smoking approximately 6 months ago.   Past Medical History:  Diagnosis Date  . Anxiety   . Hypertension      Immunizations up to date:  Yes.     Past Medical History:  Diagnosis Date  . Anxiety   . Hypertension     There are no problems to display for this patient.   History reviewed. No pertinent surgical history.  Prior to Admission medications   Medication Sig Start Date End Date Taking? Authorizing Provider  baclofen (LIORESAL) 10 MG tablet Take 1 tablet (10 mg total) by mouth 2 (two) times daily for 7 days. 05/17/19 05/24/19  Orvil Feil, PA-C  etodolac (LODINE) 400 MG tablet Take 1 tablet (400 mg total) by mouth 2 (two) times daily. 01/11/19   Tommi Rumps, PA-C  ketorolac (TORADOL) 10 MG tablet Take 1 tablet (10 mg total) by mouth every 6 (six) hours as needed for up to 5 days. 05/17/19 05/22/19  Orvil Feil,  PA-C  methocarbamol (ROBAXIN) 500 MG tablet Take 1 tablet (500 mg total) by mouth every 6 (six) hours as needed. 01/11/19   Tommi Rumps, PA-C  Multiple Vitamins-Minerals (EQ MULTIVITAMINS ADULT GUMMY) CHEW Chew 1 tablet by mouth daily.    [provider]    Allergies Augmentin [amoxicillin-pot clavulanate]  Family History  Problem Relation Age of Onset  . Diabetes Mother     Social History Social History   Tobacco Use  . Smoking status: Current Some Day Smoker    Types: Cigarettes  . Smokeless tobacco: Never Used  Substance Use Topics  . Alcohol use: No  . Drug use: No     Review of Systems  Constitutional: No fever/chills Eyes:  No discharge ENT: No upper respiratory complaints. Respiratory: no cough. No SOB/ use of accessory muscles to breath Cardiac: Patient has right sided, pleuritic chest pain.  Gastrointestinal:   No nausea, no vomiting.  No diarrhea.  No constipation. Musculoskeletal: Negative for musculoskeletal pain. Skin: Negative for rash, abrasions, lacerations, ecchymosis.    ____________________________________________   PHYSICAL EXAM:  VITAL SIGNS: ED Triage Vitals [05/17/19 1535]  Enc Vitals Group     BP (!) 146/83     Pulse Rate 62     Resp 19     Temp (!) 97.5 F (36.4 C)     Temp Source Oral     SpO2 100 %     Weight (!) 324 lb  15.3 oz (147.4 kg)     Height 5\' 3"  (1.6 m)     Head Circumference      Peak Flow      Pain Score 8     Pain Loc      Pain Edu?      Excl. in [place]?      Constitutional: Alert and oriented. Well appearing and in no acute distress. Eyes: Conjunctivae are normal. PERRL. EOMI. Head: Atraumatic. ENT:      Ears: TMs are pearly.       Nose: No congestion/rhinnorhea.      Mouth/Throat: Mucous membranes are moist.  Neck: No stridor.  No cervical spine tenderness to palpation.  Cardiovascular: Normal rate, regular rhythm. Normal S1 and S2.  Good peripheral circulation.  Patient's right anterior chest  pain is reproduced with palpation. Respiratory: Normal respiratory effort without tachypnea or retractions. Lungs CTAB. Good air entry to the bases with no decreased or absent breath sounds Gastrointestinal: Bowel sounds x 4 quadrants. Soft and nontender to palpation. No guarding or rigidity. No distention. Musculoskeletal: Full range of motion to all extremities. No obvious deformities noted Neurologic:  Normal for age. No gross focal neurologic deficits are appreciated.  Skin:  Skin is warm, dry and intact. No rash noted. Psychiatric: Mood and affect are normal for age. Speech and behavior are normal.   ____________________________________________   LABS (all labs ordered are listed, but only abnormal results are displayed)  Labs Reviewed - No data to display ____________________________________________  EKG   ____________________________________________  RADIOLOGY   No results found.  ____________________________________________    PROCEDURES  Procedure(s) performed:     Procedures     Medications  ketorolac (TORADOL) 30 MG/ML injection 30 mg (30 mg Intramuscular Given 05/17/19 1802)     ____________________________________________   INITIAL IMPRESSION / ASSESSMENT AND PLAN / ED COURSE  Pertinent labs & imaging results that were available during my care of the patient were reviewed by me and considered in my medical decision making (see chart for details).      Assessment and plan Chest pain 39 year old female presents to the emergency department with pleuritic, reproducible right-sided chest pain that has occurred for the past 2 to 3 days in association with paraspinal muscle tenderness along the right neck and right upper trapezius.  Patient vital signs were reviewed at triage and were reassuring.   Differential diagnosis included muscle spasm, PE, STEMI, NSTEMI  EKG revealed normal sinus rhythm without ST segment elevation or apparent arrhythmia.   Patient did have reproducible discomfort with palpation along the right anterior chest wall, paraspinal muscles of the cervical spine and the right upper trapezius.  I had an extensive conversation with patient about undergoing a cardiac work-up in the emergency department with CTA to rule out PE.  Patient declined cardiac work-up 2 times during this emergency department encounter, stating that she "just wanted anti-inflammatories and a muscle relaxer.".  Patient was informed of the risks associated with an undiagnosed PE, including death.  Patient stated that she would return the emergency department if her symptoms worsen.  ____________________________________________  FINAL CLINICAL IMPRESSION(S) / ED DIAGNOSES  Final diagnoses:  Chest pain, unspecified type      NEW MEDICATIONS STARTED DURING THIS VISIT:  ED Discharge Orders         Ordered    ketorolac (TORADOL) 10 MG tablet  Every 6 hours PRN     05/17/19 1804    baclofen (LIORESAL) 10 MG tablet  2  times daily     05/17/19 1804              This chart was dictated using voice recognition software/Dragon. Despite best efforts to proofread, errors can occur which can change the meaning. Any change was purely unintentional.     Orvil Feil, PA-C 05/17/19 1954    Willy Eddy, MD 05/17/19 2216

## 2019-05-17 NOTE — ED Notes (Signed)
Pt. place in 40A closet room.

## 2019-05-17 NOTE — ED Notes (Signed)
Verified with pt that she does not want to be evaluated for a PE. Pt states she understands the risk and wants to be evaluated by her PCP.
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
## 2019-12-05 ENCOUNTER — Emergency Department
Admission: EM | Admit: 2019-12-05 | Discharge: 2019-12-05 | Disposition: A | Discharge status: Home / Self Care | Payer: Self-pay | Attending: Emergency Medicine | Admitting: Emergency Medicine

## 2019-12-05 ENCOUNTER — Other Ambulatory Visit: Payer: Self-pay

## 2019-12-05 ENCOUNTER — Emergency Department: Payer: Self-pay

## 2019-12-05 DIAGNOSIS — F17210 Nicotine dependence, cigarettes, uncomplicated: Secondary | ICD-10-CM | POA: Insufficient documentation

## 2019-12-05 DIAGNOSIS — M79672 Pain in left foot: Secondary | ICD-10-CM | POA: Insufficient documentation

## 2019-12-05 DIAGNOSIS — G8929 Other chronic pain: Secondary | ICD-10-CM | POA: Insufficient documentation

## 2019-12-05 DIAGNOSIS — I10 Essential (primary) hypertension: Secondary | ICD-10-CM | POA: Insufficient documentation

## 2019-12-05 DIAGNOSIS — M79606 Pain in leg, unspecified: Secondary | ICD-10-CM

## 2019-12-05 DIAGNOSIS — M79604 Pain in right leg: Secondary | ICD-10-CM | POA: Insufficient documentation

## 2019-12-05 LAB — CBC WITH DIFFERENTIAL/PLATELET
Abs Immature Granulocytes: 0.02 10*3/uL (ref 0.00–0.07)
Basophils Absolute: 0 10*3/uL (ref 0.0–0.1)
Basophils Relative: 0 %
Eosinophils Absolute: 0.4 10*3/uL (ref 0.0–0.5)
Eosinophils Relative: 4 %
HCT: 38.6 % (ref 36.0–46.0)
Hemoglobin: 12.9 g/dL (ref 12.0–15.0)
Immature Granulocytes: 0 %
Lymphocytes Relative: 39 %
Lymphs Abs: 3.6 10*3/uL (ref 0.7–4.0)
MCH: 31.7 pg (ref 26.0–34.0)
MCHC: 33.4 g/dL (ref 30.0–36.0)
MCV: 94.8 fL (ref 80.0–100.0)
Monocytes Absolute: 0.8 10*3/uL (ref 0.1–1.0)
Monocytes Relative: 9 %
Neutro Abs: 4.5 10*3/uL (ref 1.7–7.7)
Neutrophils Relative %: 48 %
Platelets: 259 10*3/uL (ref 150–400)
RBC: 4.07 MIL/uL (ref 3.87–5.11)
RDW: 13.5 % (ref 11.5–15.5)
WBC: 9.3 10*3/uL (ref 4.0–10.5)
nRBC: 0 % (ref 0.0–0.2)

## 2019-12-05 LAB — COMPREHENSIVE METABOLIC PANEL
ALT: 18 U/L (ref 0–44)
AST: 16 U/L (ref 15–41)
Albumin: 4.1 g/dL (ref 3.5–5.0)
Alkaline Phosphatase: 61 U/L (ref 38–126)
Anion gap: 8 (ref 5–15)
BUN: 16 mg/dL (ref 6–20)
CO2: 26 mmol/L (ref 22–32)
Calcium: 9.3 mg/dL (ref 8.9–10.3)
Chloride: 105 mmol/L (ref 98–111)
Creatinine, Ser: 0.7 mg/dL (ref 0.44–1.00)
GFR, Estimated: >60 mL/min (ref >60)
Glucose, Bld: 87 mg/dL (ref 70–99)
Potassium: 3.8 mmol/L (ref 3.5–5.1)
Sodium: 139 mmol/L (ref 135–145)
Total Bilirubin: 0.7 mg/dL (ref 0.3–1.2)
Total Protein: 8 g/dL (ref 6.5–8.1)

## 2019-12-05 MED ORDER — KETOROLAC TROMETHAMINE 30 MG/ML IJ SOLN
30.0000 mg | Freq: Once | INTRAMUSCULAR | Status: AC
  Administered 2019-12-05: 30 mg via INTRAMUSCULAR
  Filled 2019-12-05: qty 1

## 2019-12-05 MED ORDER — ETODOLAC 400 MG PO TABS: 400.0000 mg | ORAL_TABLET | Freq: Two times a day (BID) | ORAL | 0 refills | Status: AC

## 2019-12-05 NOTE — Discharge Instructions (Signed)
Follow-up with your primary care provider if any continued problems.  Begin taking etodolac 400 mg twice daily with food.  Increase fluids to stay hydrated.

## 2019-12-05 NOTE — ED Notes (Signed)
See triage note, pt reports pain to right leg after hitting it on sharp object at work a month ago with intermittent difficulty walking.  NAD noted

## 2019-12-05 NOTE — ED Triage Notes (Signed)
Pt comes via POV from home with c/o left foot and right leg pain. Pt states this started couple days ago.
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
## 2020-08-18 ENCOUNTER — Emergency Department: Payer: BLUE CROSS/BLUE SHIELD

## 2020-08-18 ENCOUNTER — Emergency Department
Admission: EM | Admit: 2020-08-18 | Discharge: 2020-08-18 | Disposition: A | Discharge status: Home / Self Care | Payer: BLUE CROSS/BLUE SHIELD | Attending: Student in an Organized Health Care Education/Training Program | Admitting: Student in an Organized Health Care Education/Training Program

## 2020-08-18 ENCOUNTER — Other Ambulatory Visit: Payer: Self-pay

## 2020-08-18 DIAGNOSIS — I10 Essential (primary) hypertension: Secondary | ICD-10-CM | POA: Diagnosis not present

## 2020-08-18 DIAGNOSIS — M79661 Pain in right lower leg: Secondary | ICD-10-CM | POA: Diagnosis present

## 2020-08-18 DIAGNOSIS — I82441 Acute embolism and thrombosis of right tibial vein: Secondary | ICD-10-CM | POA: Diagnosis not present

## 2020-08-18 DIAGNOSIS — Z7901 Long term (current) use of anticoagulants: Secondary | ICD-10-CM | POA: Insufficient documentation

## 2020-08-18 DIAGNOSIS — M25571 Pain in right ankle and joints of right foot: Secondary | ICD-10-CM | POA: Insufficient documentation

## 2020-08-18 DIAGNOSIS — F17210 Nicotine dependence, cigarettes, uncomplicated: Secondary | ICD-10-CM | POA: Insufficient documentation

## 2020-08-18 MED ORDER — APIXABAN 5 MG PO TABS: ORAL_TABLET | ORAL | 6 refills | Status: DC

## 2020-08-18 NOTE — ED Triage Notes (Signed)
Pt states right ankle swelling and pain for 3 weeks. Pt is ambulatory without difficulty. Pt denies known injury, but states is on her feet daily at work.

## 2020-08-18 NOTE — Discharge Instructions (Addendum)
Please wear compression stockings at home. Take 10 mg of Eliquis in the morning and 10 mg at night for the first 7 days.  Then take 5 mg in the morning and 5 mg at night for the next 6 months. Please establish care with a primary care provider.

## 2020-08-18 NOTE — ED Provider Notes (Signed)
[facility]-EMERGENCY DEPARTMENT  ____________________________________________  Time seen: Approximately 8:20 PM  I have reviewed the triage vital signs and the nursing notes.   HISTORY  Chief Complaint Ankle Pain   Historian Patient   HPI Denise Vaughn is a 40 y.o. female presents to the emergency department with right calf pain for the past 3 weeks.  Patient states that she takes birth control at home.  She denies daily smoking or prolonged immobilization.  Denies recent surgery.  Does have a history of obesity.  Patient states that she has right posterior ankle pain that sometimes radiates along the calf.  No recent falls or mechanisms of trauma.  Patient states it has been difficult for her to walk due to the pain.   Past Medical History:  Diagnosis Date   Anxiety    Hypertension      Immunizations up to date:  Yes.     Past Medical History:  Diagnosis Date   Anxiety    Hypertension     There are no problems to display for this patient.   No past surgical history on file.  Prior to Admission medications   Medication Sig Start Date End Date Taking? Authorizing Provider  apixaban (ELIQUIS) 5 MG TABS tablet Take 10 mg in the morning and 10 mg in the evening for the first 7 days.  After 7 days, take 5 mg in the morning and 5 mg in the evening for the next 6 months. 08/18/20  Yes Pia Mau M, PA-C  etodolac (LODINE) 400 MG tablet Take 1 tablet (400 mg total) by mouth 2 (two) times daily. 12/05/19   Tommi Rumps, PA-C  Multiple Vitamins-Minerals (EQ MULTIVITAMINS ADULT GUMMY) CHEW Chew 1 tablet by mouth daily.    [provider]    Allergies Augmentin [amoxicillin-pot clavulanate]  Family History  Problem Relation Age of Onset   Diabetes Mother     Social History Social History   Tobacco Use   Smoking status: Some Days    Pack years: 0.00    Types: Cigarettes   Smokeless tobacco: Never  Vaping Use   Vaping Use: Never used  Substance  Use Topics   Alcohol use: No   Drug use: No     Review of Systems  Constitutional: No fever/chills Eyes:  No discharge ENT: No upper respiratory complaints. Respiratory: no cough. No SOB/ use of accessory muscles to breath Gastrointestinal:   No nausea, no vomiting.  No diarrhea.  No constipation. Musculoskeletal: Patient has right calf pain and ankle pain.  Skin: Negative for rash, abrasions, lacerations, ecchymosis.   ____________________________________________   PHYSICAL EXAM:  VITAL SIGNS: ED Triage Vitals [08/18/20 1924]  Enc Vitals Group     BP (!) 148/105     Pulse Rate 88     Resp 16     Temp 98 F (36.7 C)     Temp src      SpO2 100 %     Weight (!) 350 lb (158.8 kg)     Height 5\' 10"  (1.778 m)     Head Circumference      Peak Flow      Pain Score 7     Pain Loc      Pain Edu?      Excl. in GC?      Constitutional: Alert and oriented. Well appearing and in no acute distress. Eyes: Conjunctivae are normal. PERRL. EOMI. Head: Atraumatic. ENT: Cardiovascular: Normal rate, regular rhythm. Normal S1 and S2.  Good peripheral circulation. Respiratory: Normal respiratory effort without tachypnea or retractions. Lungs CTAB. Good air entry to the bases with no decreased or absent breath sounds Gastrointestinal: Bowel sounds x 4 quadrants. Soft and nontender to palpation. No guarding or rigidity. No distention. Musculoskeletal: Full range of motion to all extremities. No obvious deformities noted.  No erythema or edema of the right lower extremity.  Palpable dorsalis pedis pulse bilaterally and symmetrically.  Capillary refill less than 2 seconds on the right. Neurologic:  Normal for age. No gross focal neurologic deficits are appreciated.  Skin:  Skin is warm, dry and intact. No rash noted. Psychiatric: Mood and affect are normal for age. Speech and behavior are normal.   ____________________________________________   LABS (all labs ordered are listed, but  only abnormal results are displayed)  Labs Reviewed - No data to display ____________________________________________  EKG   ____________________________________________  RADIOLOGY Geraldo Pitter, personally viewed and evaluated these images (plain radiographs) as part of my medical decision making, as well as reviewing the written report by the radiologist.  DG Ankle Complete Right  Result Date: 08/18/2020 CLINICAL DATA:  Right ankle pain, swelling EXAM: RIGHT ANKLE - COMPLETE 3+ VIEW COMPARISON:  None. FINDINGS: Plantar and posterior calcaneal spurs. No acute bony abnormality. Specifically, no fracture, subluxation, or dislocation. Degenerative changes in the right ankle and hindfoot. IMPRESSION: No acute bony abnormality. Electronically Signed   By: Charlett Nose M.D.   On: 08/18/2020 20:02   US Venous Img Lower Unilateral Right  Result Date: 08/18/2020 CLINICAL DATA:  Right lower extremity swelling. EXAM: RIGHT LOWER EXTREMITY VENOUS DOPPLER ULTRASOUND TECHNIQUE: Gray-scale sonography with compression, as well as color and duplex ultrasound, were performed to evaluate the deep venous system(s) from the level of the common femoral vein through the popliteal and proximal calf veins. COMPARISON:  None. FINDINGS: VENOUS Normal compressibility of the RIGHT common femoral, superficial femoral, and popliteal veins, as well as the RIGHT peroneal vein. Visualized portions of RIGHT profunda femoral vein and RIGHT great saphenous vein are unremarkable. Abnormal compressibility of the RIGHT posterior tibial vein is seen. Associated filling defects suggestive of DVT is noted on grayscale or color Doppler imaging. Doppler waveforms show abnormal direction of venous flow, abnormal respiratory plasticity and abnormal response to augmentation within the RIGHT posterior tibial vein. Limited views of the contralateral common femoral vein are unremarkable. OTHER None. Limitations: none IMPRESSION: Findings  consistent with occlusive thrombus within the RIGHT posterior tibial vein. Electronically Signed   By: Aram Candela M.D.   On: 08/18/2020 21:21    ____________________________________________    PROCEDURES  Procedure(s) performed:     Procedures     Medications - No data to display   ____________________________________________   INITIAL IMPRESSION / ASSESSMENT AND PLAN / ED COURSE  Pertinent labs & imaging results that were available during my care of the patient were reviewed by me and considered in my medical decision making (see chart for details).      Assessment and Plan: Ankle pain:  Calf pain:  40 year old female presents to the emergency department with right calf pain for the past 3 weeks.   Vital signs were reassuring at triage. On physical exam, patient had no significant erythema but did have tenderness to right distal calf.   No bony abnormalities were visualized on xray of the right ankle. Venous ultrasound indicated occlusive thrombus of the right posterior tibial vein.   Patient was started on Eliquis. Recommended compression stockings and daily ambulation.  Patient denies chest pain, chest tightness or shortness of breath. Recommended return to the ED for revaluation if aforementioned symptoms occur.     ____________________________________________  FINAL CLINICAL IMPRESSION(S) / ED DIAGNOSES  Final diagnoses:  Acute deep vein thrombosis (DVT) of tibial vein of right lower extremity (HCC)      NEW MEDICATIONS STARTED DURING THIS VISIT:  ED Discharge Orders          Ordered    apixaban (ELIQUIS) 5 MG TABS tablet        08/18/20 2139                This chart was dictated using voice recognition software/Dragon. Despite best efforts to proofread, errors can occur which can change the meaning. Any change was purely unintentional.     Gasper Lloyd 08/18/20 2303    Willy Eddy, MD 08/18/20 2330

## 2020-08-19 ENCOUNTER — Telehealth: Payer: Self-pay | Admitting: Emergency Medicine

## 2020-08-19 MED ORDER — RIVAROXABAN (XARELTO) VTE STARTER PACK (15 & 20 MG): ORAL_TABLET | ORAL | 0 refills | Status: AC

## 2020-08-19 NOTE — Telephone Encounter (Signed)
Switching rx from eliquis to xarelto due to insurance plan

## 2020-10-02 ENCOUNTER — Other Ambulatory Visit: Payer: Self-pay | Admitting: Family Medicine

## 2020-10-02 DIAGNOSIS — Z86718 Personal history of other venous thrombosis and embolism: Secondary | ICD-10-CM

## 2020-10-02 DIAGNOSIS — M7989 Other specified soft tissue disorders: Secondary | ICD-10-CM

## 2020-10-07 ENCOUNTER — Ambulatory Visit: Payer: BLUE CROSS/BLUE SHIELD

## 2020-10-16 ENCOUNTER — Other Ambulatory Visit: Payer: Self-pay

## 2020-10-16 ENCOUNTER — Ambulatory Visit
Admission: RE | Admit: 2020-10-16 | Discharge: 2020-10-16 | Disposition: A | Discharge status: Home / Self Care | Payer: BLUE CROSS/BLUE SHIELD | Source: Ambulatory Visit | Attending: Family Medicine | Admitting: Family Medicine

## 2020-10-16 DIAGNOSIS — Z86718 Personal history of other venous thrombosis and embolism: Secondary | ICD-10-CM | POA: Insufficient documentation

## 2020-10-16 DIAGNOSIS — M7989 Other specified soft tissue disorders: Secondary | ICD-10-CM | POA: Insufficient documentation
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
## 2021-12-23 ENCOUNTER — Telehealth: Payer: Self-pay | Admitting: "Endocrinology

## 2021-12-23 NOTE — Telephone Encounter (Signed)
Received referral on pt 9/7. Called pt left vm 9/8, mailed letter 10/1. No response. Closed referral

## 2022-06-29 ENCOUNTER — Ambulatory Visit: Payer: Self-pay

## 2022-11-04 ENCOUNTER — Ambulatory Visit
Admission: RE | Admit: 2022-11-04 | Discharge: 2022-11-04 | Disposition: A | Discharge status: Home / Self Care | Payer: BLUE CROSS/BLUE SHIELD | Source: Ambulatory Visit

## 2022-11-04 VITALS — BP 169/96 | HR 85 | Temp 98.8°F | Resp 18 | Wt 325.0 lb

## 2022-11-04 DIAGNOSIS — Z8679 Personal history of other diseases of the circulatory system: Secondary | ICD-10-CM

## 2022-11-04 DIAGNOSIS — R739 Hyperglycemia, unspecified: Secondary | ICD-10-CM

## 2022-11-04 DIAGNOSIS — R030 Elevated blood-pressure reading, without diagnosis of hypertension: Secondary | ICD-10-CM | POA: Diagnosis not present

## 2022-11-04 DIAGNOSIS — Z760 Encounter for issue of repeat prescription: Secondary | ICD-10-CM

## 2022-11-04 DIAGNOSIS — Z8639 Personal history of other endocrine, nutritional and metabolic disease: Secondary | ICD-10-CM | POA: Diagnosis not present

## 2022-11-04 HISTORY — DX: Prediabetes: R73.03

## 2022-11-04 LAB — BASIC METABOLIC PANEL
Anion gap: 9 (ref 5–15)
BUN: 12 mg/dL (ref 6–20)
CO2: 27 mmol/L (ref 22–32)
Calcium: 9.7 mg/dL (ref 8.9–10.3)
Chloride: 96 mmol/L — ABNORMAL LOW (ref 98–111)
Creatinine, Ser: 0.75 mg/dL (ref 0.44–1.00)
GFR, Estimated: >60 mL/min (ref >60)
Glucose, Bld: 171 mg/dL — ABNORMAL HIGH (ref 70–99)
Potassium: 3.8 mmol/L (ref 3.5–5.1)
Sodium: 132 mmol/L — ABNORMAL LOW (ref 135–145)

## 2022-11-04 LAB — GLUCOSE, CAPILLARY: Glucose-Capillary: 183 mg/dL — ABNORMAL HIGH (ref 70–99)

## 2022-11-04 MED ORDER — AMLODIPINE BESYLATE 5 MG PO TABS: 5.0000 mg | ORAL_TABLET | Freq: Every day | ORAL | 0 refills | Status: AC

## 2022-11-04 MED ORDER — METFORMIN HCL 500 MG PO TABS: 500.0000 mg | ORAL_TABLET | Freq: Two times a day (BID) | ORAL | 0 refills | Status: AC

## 2022-11-04 NOTE — Discharge Instructions (Addendum)
We have refilled your Norvasc and Metformin. Please follow up with PCP for further refills. Take meds as directed. Avoids extra salt and processed foods. Go to ER for new or worsening issues or concerns(chest pain,shortness of breath,palpitations, etc).

## 2022-11-04 NOTE — ED Provider Notes (Signed)
[facility]    CSN: [id number] Arrival date & time: 11/04/22  1803      History   Chief Complaint Chief Complaint  Patient presents with   Blood Sugar Problem    My general dr office closed and I need my prescriptions renewed until I can find another dr. Margaret Vaughn by patient    HPI Denise Vaughn is a 42 y.o. female.   42 year old female pt, Denise Vaughn, presents to urgent care for refill of meds. Pt has hx of HTN and DM. Pt denies any symptoms at present.   The history is provided by the patient. No language interpreter was used.    Past Medical History:  Diagnosis Date   Anxiety    Hypertension    Prediabetes     Patient Active Problem List   Diagnosis Date Noted   Encounter for medication refill 11/04/2022   History of hypertension 11/04/2022   History of diabetes mellitus 11/04/2022   Hyperglycemia 11/04/2022   Elevated blood pressure reading 11/04/2022    History reviewed. No pertinent surgical history.  OB History     Gravida  0   Para  0   Term  0   Preterm  0   AB  0   Living  0      SAB  0   IAB  0   Ectopic  0   Multiple  0   Live Births  0            Home Medications    Prior to Admission medications   Medication Sig Start Date End Date Taking? Authorizing Provider  hydrochlorothiazide (HYDRODIURIL) 25 MG tablet Take 25 mg by mouth daily. 07/31/22  Yes [provider]  amLODipine (NORVASC) 5 MG tablet Take 1 tablet (5 mg total) by mouth daily. 11/04/22 12/04/22  Allee Busk, Para March, NP  etodolac (LODINE) 400 MG tablet Take 1 tablet (400 mg total) by mouth 2 (two) times daily. 12/05/19   Tommi Rumps, PA-C  metFORMIN (GLUCOPHAGE) 500 MG tablet Take 1 tablet (500 mg total) by mouth 2 (two) times daily with a meal. 11/04/22 12/04/22  Codylee Patil, Para March, NP  Multiple Vitamins-Minerals (EQ MULTIVITAMINS ADULT GUMMY) CHEW Chew 1 tablet by mouth daily.    [provider]  RIVAROXABAN  Carlena Hurl) VTE STARTER PACK (15 & 20 MG) Follow package directions: Take one 15mg  tablet by mouth twice a day. On day 22, switch to one 20mg  tablet once a day. Take with food. 08/19/20   Sharman Cheek, MD    Family History Family History  Problem Relation Age of Onset   Diabetes Mother     Social History Social History   Tobacco Use   Smoking status: Former    Types: Cigarettes   Smokeless tobacco: Never  Vaping Use   Vaping status: Never Used  Substance Use Topics   Alcohol use: No   Drug use: No     Allergies   Augmentin [amoxicillin-pot clavulanate]   Review of Systems Review of Systems  Respiratory:  Negative for chest tightness.   Cardiovascular:  Negative for chest pain, palpitations and leg swelling.  All other systems reviewed and are negative.    Physical Exam Triage Vital Signs ED Triage Vitals  Encounter Vitals Group     BP      Systolic BP Percentile      Diastolic BP Percentile      Pulse      Resp  Temp      Temp src      SpO2      Weight      Height      Head Circumference      Peak Flow      Pain Score      Pain Loc      Pain Education      Exclude from Growth Chart    No data found.  Updated Vital Signs BP (!) 169/96 (BP Location: Right Wrist)   Pulse 85   Temp 98.8 F (37.1 C) (Oral)   Resp 18   Wt (!) 325 lb (147.4 kg)   SpO2 96%   BMI 46.63 kg/m   Visual Acuity Right Eye Distance:   Left Eye Distance:   Bilateral Distance:    Right Eye Near:   Left Eye Near:    Bilateral Near:     Physical Exam Vitals and nursing note reviewed.  Constitutional:      General: She is not in acute distress.    Appearance: She is well-developed.  HENT:     Head: Normocephalic and atraumatic.  Eyes:     Conjunctiva/sclera: Conjunctivae normal.  Cardiovascular:     Rate and Rhythm: Normal rate and regular rhythm.     Heart sounds: No murmur heard. Pulmonary:     Effort: Pulmonary effort is normal. No respiratory distress.      Breath sounds: Normal breath sounds.  Abdominal:     Palpations: Abdomen is soft.     Tenderness: There is no abdominal tenderness.  Musculoskeletal:        General: No swelling.     Cervical back: Neck supple.  Skin:    General: Skin is warm and dry.     Capillary Refill: Capillary refill takes less than 2 seconds.  Neurological:     Mental Status: She is alert.  Psychiatric:        Mood and Affect: Mood normal.      UC Treatments / Results  Labs (all labs ordered are listed, but only abnormal results are displayed) Labs Reviewed  GLUCOSE, CAPILLARY - Abnormal; Notable for the following components:      Result Value   Glucose-Capillary 183 (*)    All other components within normal limits  BASIC METABOLIC PANEL - Abnormal; Notable for the following components:   Sodium 132 (*)    Chloride 96 (*)    Glucose, Bld 171 (*)    All other components within normal limits    EKG   Radiology No results found.  Procedures Procedures (including critical care time)  Medications Ordered in UC Medications - No data to display  Initial Impression / Assessment and Plan / UC Course  I have reviewed the triage vital signs and the nursing notes.  Pertinent labs & imaging results that were available during my care of the patient were reviewed by me and considered in my medical decision making (see chart for details).    Discussed exam findings and plan of care with patient, strict go to ER precautions given.   Patient verbalized understanding to this provider.  Ddx: HTN,DM, refill of medications Final Clinical Impressions(s) / UC Diagnoses   Final diagnoses:  Encounter for medication refill  History of hypertension  History of diabetes mellitus  Hyperglycemia  Elevated blood pressure reading     Discharge Instructions      We have refilled your Norvasc and Metformin. Please follow up with PCP for further refills.  Take meds as directed. Avoids extra salt and  processed foods. Go to ER for new or worsening issues or concerns(chest pain,shortness of breath,palpitations, etc).     ED Prescriptions     Medication Sig Dispense Auth. Provider   amLODipine (NORVASC) 5 MG tablet Take 1 tablet (5 mg total) by mouth daily. 30 tablet Melrose Kearse, NP   metFORMIN (GLUCOPHAGE) 500 MG tablet Take 1 tablet (500 mg total) by mouth 2 (two) times daily with a meal. 60 tablet Mckinlee Dunk, NP      PDMP not reviewed this encounter.   Clancy Gourd, NP 11/04/22 2104

## 2022-11-04 NOTE — ED Triage Notes (Signed)
Patient here to get refill on meds. She has 3-4 pills of each left.   Metformin 500 mg 2 tablets BID Amlodipine 5 mg once daily  HYDROCHLOROTHIAZIDE 25 MG TAB   Once daily    Patient also states that she has small rashes. One on each arm and her chest.

## 2023-09-21 ENCOUNTER — Emergency Department

## 2023-09-21 ENCOUNTER — Other Ambulatory Visit: Payer: Self-pay

## 2023-09-21 ENCOUNTER — Emergency Department
Admission: EM | Admit: 2023-09-21 | Discharge: 2023-09-21 | Disposition: A | Discharge status: Home / Self Care | Attending: Emergency Medicine | Admitting: Emergency Medicine

## 2023-09-21 ENCOUNTER — Encounter: Payer: Self-pay | Admitting: Emergency Medicine

## 2023-09-21 DIAGNOSIS — I10 Essential (primary) hypertension: Secondary | ICD-10-CM | POA: Diagnosis not present

## 2023-09-21 DIAGNOSIS — F419 Anxiety disorder, unspecified: Secondary | ICD-10-CM | POA: Diagnosis not present

## 2023-09-21 DIAGNOSIS — R21 Rash and other nonspecific skin eruption: Secondary | ICD-10-CM | POA: Insufficient documentation

## 2023-09-21 DIAGNOSIS — E119 Type 2 diabetes mellitus without complications: Secondary | ICD-10-CM | POA: Diagnosis not present

## 2023-09-21 DIAGNOSIS — R0602 Shortness of breath: Secondary | ICD-10-CM

## 2023-09-21 DIAGNOSIS — R0789 Other chest pain: Secondary | ICD-10-CM | POA: Diagnosis present

## 2023-09-21 LAB — CBC
HCT: 40.6 % (ref 36.0–46.0)
Hemoglobin: 13.6 g/dL (ref 12.0–15.0)
MCH: 31.8 pg (ref 26.0–34.0)
MCHC: 33.5 g/dL (ref 30.0–36.0)
MCV: 94.9 fL (ref 80.0–100.0)
Platelets: 302 K/uL (ref 150–400)
RBC: 4.28 MIL/uL (ref 3.87–5.11)
RDW: 12.6 % (ref 11.5–15.5)
WBC: 9.9 K/uL (ref 4.0–10.5)
nRBC: 0 % (ref 0.0–0.2)

## 2023-09-21 LAB — TROPONIN I (HIGH SENSITIVITY): Troponin I (High Sensitivity): 5 ng/L (ref <18)

## 2023-09-21 LAB — D-DIMER, QUANTITATIVE: D-Dimer, Quant: 0.41 ug{FEU}/mL (ref 0.00–0.50)

## 2023-09-21 LAB — BASIC METABOLIC PANEL WITH GFR
Anion gap: 12 (ref 5–15)
BUN: 8 mg/dL (ref 6–20)
CO2: 22 mmol/L (ref 22–32)
Calcium: 9.6 mg/dL (ref 8.9–10.3)
Chloride: 102 mmol/L (ref 98–111)
Creatinine, Ser: 0.63 mg/dL (ref 0.44–1.00)
GFR, Estimated: >60 mL/min (ref >60)
Glucose, Bld: 167 mg/dL — ABNORMAL HIGH (ref 70–99)
Potassium: 3.8 mmol/L (ref 3.5–5.1)
Sodium: 136 mmol/L (ref 135–145)

## 2023-09-21 LAB — HCG, QUANTITATIVE, PREGNANCY: hCG, Beta Chain, Quant, S: <1 m[IU]/mL (ref <5)

## 2023-09-21 MED ORDER — HYDROXYZINE HCL 25 MG PO TABS
25.0000 mg | ORAL_TABLET | Freq: Once | ORAL | Status: AC
  Administered 2023-09-21: 25 mg via ORAL
  Filled 2023-09-21: qty 1

## 2023-09-21 MED ORDER — HYDROXYZINE HCL 25 MG PO TABS: 25.0000 mg | ORAL_TABLET | Freq: Three times a day (TID) | ORAL | 0 refills | Status: DC | PRN

## 2023-09-21 MED ORDER — HYDROXYZINE HCL 25 MG PO TABS: 25.0000 mg | ORAL_TABLET | Freq: Three times a day (TID) | ORAL | 0 refills | Status: AC | PRN

## 2023-09-21 NOTE — Discharge Instructions (Addendum)
 Please make sure to follow-up with your primary care doctor for further management of your symptoms as well as to discuss possibly starting some antianxiety medications.

## 2023-09-21 NOTE — ED Provider Notes (Signed)
 [facility] Provider Note    Event Date/Time   First MD Initiated Contact with Patient 09/21/23 1411     (approximate)   History   Chest Pain   HPI  Denise Vaughn is a 43 y.o. female history of hypertension, anxiety, not on any medications for anxiety presenting with throat tightness, shortness of breath and chest tightness.  States that this has been intermittently happening for the last 3 weeks.  States that this feels like a panic attack.  Has been having increased stress lately.  Denies any new medication changes.  Has not taken anything for her anxiety or symptoms.  States she does have history of a prior DVT in the setting of OCP use, has stopped her OCP and her anticoagulation was also discontinued.  She denies any recent travel or surgeries, no unilateral calf swelling or tenderness, no hormone use, no history of malignancies.  States that the shortness of breath only comes on when she feels the chest tightness and this is typically when she starts to become emotional or stressed out.  She denies any prior cardiac history, no prior history of MI.  On independent review, she was seen in February by her primary care doctor, history of diabetes history of hypertension, her diabetes during that visit was not controlled, A1c was 12.9.     Physical Exam   Triage Vital Signs: ED Triage Vitals  Encounter Vitals Group     BP 09/21/23 1205 (!) 160/100     Girls Systolic BP Percentile --      Girls Diastolic BP Percentile --      Boys Systolic BP Percentile --      Boys Diastolic BP Percentile --      Pulse Rate 09/21/23 1205 80     Resp 09/21/23 1205 16     Temp 09/21/23 1205 98.2 F (36.8 C)     Temp Source 09/21/23 1205 Oral     SpO2 09/21/23 1205 96 %     Weight --      Height --      Head Circumference --      Peak Flow --      Pain Score 09/21/23 1207 2     Pain Loc --      Pain Education --      Exclude from Growth Chart --     Most  recent vital signs: Vitals:   09/21/23 1205  BP: (!) 160/100  Pulse: 80  Resp: 16  Temp: 98.2 F (36.8 C)  SpO2: 96%     General: Awake, no distress.  CV:  Good peripheral perfusion.  Resp:  Normal effort.  Clear Abd:  No distention.  Soft nontender Other:  Clear oropharynx, no unilateral counseling or tenderness, no lower extremity edema   ED Results / Procedures / Treatments   Labs (all labs ordered are listed, but only abnormal results are displayed) Labs Reviewed  BASIC METABOLIC PANEL WITH GFR - Abnormal; Notable for the following components:      Result Value   Glucose, Bld 167 (*)    All other components within normal limits  CBC  HCG, QUANTITATIVE, PREGNANCY  D-DIMER, QUANTITATIVE  POC URINE PREG, ED  TROPONIN I (HIGH SENSITIVITY)     EKG  EKG shows, rhythm, rate is 87, normal QRS, normal QTc, no obvious ischemic ST elevation, T wave flattening in aVF, V3, T wave flattening is new compared to prior   RADIOLOGY On my  independent interpretation, chest x-ray without obvious consolidation   PROCEDURES:  Critical Care performed: No  Procedures   MEDICATIONS ORDERED IN ED: Medications  hydrOXYzine  (ATARAX ) tablet 25 mg (25 mg Oral Given 09/21/23 1449)     IMPRESSION / MDM / ASSESSMENT AND PLAN / ED COURSE  I reviewed the triage vital signs and the nursing notes.                              Differential diagnosis includes, but is not limited to, angina, ACS, arrhythmia, anxiety, did consider PE given prior history of DVT but she has no unilateral calf tenderness, no hypoxia, is no longer on OCPs, will get a D-dimer, labs, EKG, troponin, chest x-ray.  Will give her some hydroxyzine  here.  Patient's presentation is most consistent with acute presentation with potential threat to life or bodily function.  Independent interpretation of labs and imaging below.  On reassessment patient is feeling better after the hydroxyzine .  Does note that she has a dry  pruritic papular rash to her bilateral forearms, she says no new exposures, it is not tender, is blanching, it is raised.  Rash does not appear to be hives.  Could be eczema.  Discussed with her about using Aquaphor or Vaseline and as well as moisturizers.  Otherwise considered but no indication for inpatient admission at this time, she safe for outpatient management.  Will give her prescription for hydroxyzine  that she can take outpatient.  Discussed with her about following up with her primary care doctor to get started on antianxiety medications.  Strict cautions given.  Shared decision making done with patient and she is agreeable with this plan.  Will discharge with strict precautions.  The patient is on the cardiac monitor to evaluate for evidence of arrhythmia and/or significant heart rate changes.   Clinical Course as of 09/21/23 1523  Tue Sep 21, 2023  1411 DG Chest 2 View No acute cardiopulmonary disease.  [TT]  1415 Independent review of labs, electrolytes not severely deranged, troponin is not elevated, no leukocytosis. [TT]  1447 HCG, Beta Chain, Quant, S: <1 Negative [TT]  1519 D-dimer, quantitative Not elevated. [TT]    Clinical Course User Index [TT] Waymond Lorelle Cummins, MD     FINAL CLINICAL IMPRESSION(S) / ED DIAGNOSES   Final diagnoses:  Chest tightness  Shortness of breath  Anxiety  Rash     Rx / DC Orders   ED Discharge Orders          Ordered    hydrOXYzine  (ATARAX ) 25 MG tablet  3 times daily PRN        09/21/23 1518             Note:  This document was prepared using Dragon voice recognition software and may include unintentional dictation errors.    Waymond Lorelle Cummins, MD 09/21/23 [phone]

## 2023-09-21 NOTE — ED Triage Notes (Signed)
 Pt has had throat tightening and intermittent chest pain for the last couple weeks. Pt describes it like she's having anxiety like symptoms and a lump in my throat. Pt does not state anything significant happening prior to symptoms except increased stress factors in life. No respiratory distress noted, pt states she has SOB intermittently.Mild, red rash on bilateral arms which started about a month ago.

## 2023-09-21 NOTE — ED Notes (Signed)
 Urine sample sent

## 2023-09-21 NOTE — ED Notes (Signed)
 See triage note Presents with some chest tightness and some SOB   Denies any fever or cough
